# Patient Record
Sex: Female | Born: 2000 | Race: Black or African American | Hispanic: No | Marital: Single | Smoking: Never smoker
Health system: Southern US, Community
[De-identification: ages and names within clinical notes are randomized; demographics above are authoritative.]

---
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
## 2021-08-28 ENCOUNTER — Other Ambulatory Visit: Payer: Self-pay

## 2021-08-28 ENCOUNTER — Encounter (HOSPITAL_COMMUNITY): Payer: Self-pay | Admitting: Emergency Medicine

## 2021-08-28 ENCOUNTER — Emergency Department (HOSPITAL_COMMUNITY)
Admission: EM | Admit: 2021-08-28 | Discharge: 2021-08-29 | Disposition: A | Discharge status: Home / Self Care | Payer: Medicaid Other | Attending: Emergency Medicine | Admitting: Emergency Medicine

## 2021-08-28 DIAGNOSIS — D72829 Elevated white blood cell count, unspecified: Secondary | ICD-10-CM | POA: Insufficient documentation

## 2021-08-28 DIAGNOSIS — J36 Peritonsillar abscess: Secondary | ICD-10-CM | POA: Diagnosis not present

## 2021-08-28 DIAGNOSIS — J029 Acute pharyngitis, unspecified: Secondary | ICD-10-CM | POA: Diagnosis present

## 2021-08-28 LAB — GROUP A STREP BY PCR: Group A Strep by PCR: NOT DETECTED

## 2021-08-28 NOTE — ED Triage Notes (Signed)
Pt c/o sore throat and left ear pain x 4 days.

## 2021-08-29 ENCOUNTER — Emergency Department (HOSPITAL_COMMUNITY): Payer: Medicaid Other

## 2021-08-29 LAB — BASIC METABOLIC PANEL
Anion gap: 9 (ref 5–15)
BUN: 8 mg/dL (ref 6–20)
CO2: 21 mmol/L — ABNORMAL LOW (ref 22–32)
Calcium: 9.1 mg/dL (ref 8.9–10.3)
Chloride: 102 mmol/L (ref 98–111)
Creatinine, Ser: 0.85 mg/dL (ref 0.44–1.00)
GFR, Estimated: >60 mL/min (ref >60)
Glucose, Bld: 101 mg/dL — ABNORMAL HIGH (ref 70–99)
Potassium: 4.2 mmol/L (ref 3.5–5.1)
Sodium: 132 mmol/L — ABNORMAL LOW (ref 135–145)

## 2021-08-29 LAB — CBC WITH DIFFERENTIAL/PLATELET
Abs Immature Granulocytes: 0.05 10*3/uL (ref 0.00–0.07)
Basophils Absolute: 0.1 10*3/uL (ref 0.0–0.1)
Basophils Relative: 0 %
Eosinophils Absolute: 0.1 10*3/uL (ref 0.0–0.5)
Eosinophils Relative: 1 %
HCT: 35.3 % — ABNORMAL LOW (ref 36.0–46.0)
Hemoglobin: 11.7 g/dL — ABNORMAL LOW (ref 12.0–15.0)
Immature Granulocytes: 0 %
Lymphocytes Relative: 15 %
Lymphs Abs: 1.7 10*3/uL (ref 0.7–4.0)
MCH: 28.5 pg (ref 26.0–34.0)
MCHC: 33.1 g/dL (ref 30.0–36.0)
MCV: 86.1 fL (ref 80.0–100.0)
Monocytes Absolute: 0.8 10*3/uL (ref 0.1–1.0)
Monocytes Relative: 7 %
Neutro Abs: 8.8 10*3/uL — ABNORMAL HIGH (ref 1.7–7.7)
Neutrophils Relative %: 77 %
Platelets: 276 10*3/uL (ref 150–400)
RBC: 4.1 MIL/uL (ref 3.87–5.11)
RDW: 12 % (ref 11.5–15.5)
WBC: 11.5 10*3/uL — ABNORMAL HIGH (ref 4.0–10.5)
nRBC: 0 % (ref 0.0–0.2)

## 2021-08-29 LAB — I-STAT BETA HCG BLOOD, ED (MC, WL, AP ONLY): I-stat hCG, quantitative: <5 m[IU]/mL (ref <5)

## 2021-08-29 MED ORDER — DEXAMETHASONE SODIUM PHOSPHATE 10 MG/ML IJ SOLN
10.0000 mg | Freq: Once | INTRAMUSCULAR | Status: AC
  Administered 2021-08-29: 10 mg via INTRAVENOUS
  Filled 2021-08-29: qty 1

## 2021-08-29 MED ORDER — AMOXICILLIN-POT CLAVULANATE 875-125 MG PO TABS
1.0000 | ORAL_TABLET | Freq: Once | ORAL | Status: AC
  Administered 2021-08-29: 1 via ORAL
  Filled 2021-08-29: qty 1

## 2021-08-29 MED ORDER — IOHEXOL 300 MG/ML  SOLN
100.0000 mL | Freq: Once | INTRAMUSCULAR | Status: AC | PRN
  Administered 2021-08-29: 100 mL via INTRAVENOUS

## 2021-08-29 MED ORDER — LACTATED RINGERS IV BOLUS
1000.0000 mL | Freq: Once | INTRAVENOUS | Status: AC
  Administered 2021-08-29: 1000 mL via INTRAVENOUS

## 2021-08-29 MED ORDER — AMOXICILLIN-POT CLAVULANATE 875-125 MG PO TABS: 1.0000 | ORAL_TABLET | Freq: Two times a day (BID) | ORAL | 0 refills | Status: AC

## 2021-08-29 NOTE — ED Provider Notes (Signed)
? ?[facility] EMERGENCY DEPARTMENT  ?Provider Note ? ?CSN: [id number] ?Arrival date & time: 08/28/21 1918 ? ?History ?Chief Complaint  ?Patient presents with  ? Sore Throat  ? ? ?Ann Brennan is a 21 y.o. female reports several days of worsening L sided sore throat, worse with swallowing, radiating into L ear. Subjective fevers at home.  ? ? ?Home Medications ?Prior to Admission medications   ?Medication Sig Start Date End Date Taking? Authorizing Provider  ?amoxicillin-clavulanate (AUGMENTIN) 875-125 MG tablet Take 1 tablet by mouth every 12 (twelve) hours. 08/29/21  Yes Pollyann Savoy, MD  ?diclofenac (VOLTAREN) 75 MG EC tablet Take 1 tablet (75 mg total) by mouth 2 (two) times daily. 12/17/19   Mardella Layman, MD  ?ondansetron (ZOFRAN ODT) 4 MG disintegrating tablet Take 1 tablet (4 mg total) by mouth every 8 (eight) hours as needed. ?Patient not taking: Reported on 07/20/2017 01/20/15   Ree Shay, MD  ?predniSONE (STERAPRED UNI-PAK 21 TAB) 10 MG (21) TBPK tablet Take by mouth daily. Take as directed. 12/17/19   Mardella Layman, MD  ? ? ? ?Allergies    ?Patient has no known allergies. ? ? ?Review of Systems   ?Review of Systems ?Please see HPI for pertinent positives and negatives ? ?Physical Exam ?BP 115/82 (BP Location: Right Arm)   Pulse 88   Temp 99 ?F (37.2 ?C) (Oral)   Resp 18   Ht 5\' 8"  (1.727 m)   Wt 94.3 kg   LMP 08/04/2021 (Exact Date)   SpO2 99%   BMI 31.63 kg/m?  ? ?Physical Exam ?Vitals and nursing note reviewed.  ?Constitutional:   ?   Appearance: Normal appearance.  ?HENT:  ?   Head: Normocephalic and atraumatic.  ?   Nose: Nose normal.  ?   Mouth/Throat:  ?   Mouth: Mucous membranes are moist.  ?   Pharynx: Uvula midline. Posterior oropharyngeal erythema present. No oropharyngeal exudate.  ?   Tonsils: Tonsillar abscess (likely, left) present. No tonsillar exudate. 1+ on the right. 3+ on the left.  ?   Comments: Hot potato voice ?Eyes:  ?   Extraocular Movements:  Extraocular movements intact.  ?   Conjunctiva/sclera: Conjunctivae normal.  ?Cardiovascular:  ?   Rate and Rhythm: Normal rate.  ?Pulmonary:  ?   Effort: Pulmonary effort is normal.  ?   Breath sounds: Normal breath sounds.  ?Abdominal:  ?   General: Abdomen is flat.  ?   Palpations: Abdomen is soft.  ?   Tenderness: There is no abdominal tenderness.  ?Musculoskeletal:     ?   General: No swelling. Normal range of motion.  ?   Cervical back: Neck supple.  ?Lymphadenopathy:  ?   Cervical: Cervical adenopathy present.  ?Skin: ?   General: Skin is warm and dry.  ?Neurological:  ?   General: No focal deficit present.  ?   Mental Status: She is alert.  ?Psychiatric:     ?   Mood and Affect: Mood normal.  ? ? ?ED Results / Procedures / Treatments   ?EKG ?None ? ?Procedures ?Procedures ? ?Medications Ordered in the ED ?Medications  ?amoxicillin-clavulanate (AUGMENTIN) 875-125 MG per tablet 1 tablet (has no administration in time range)  ?lactated ringers bolus 1,000 mL (1,000 mLs Intravenous New Bag/Given 08/29/21 0257)  ?dexamethasone (DECADRON) injection 10 mg (10 mg Intravenous Given 08/29/21 0257)  ?iohexol (OMNIPAQUE) 300 MG/ML solution 100 mL (100 mLs Intravenous Contrast Given 08/29/21 0322)  ? ? ?  Initial Impression and Plan ? Patient with exam concerning for peritonsillar abscess. Strep was neg. Will add labs, CT and decadron for swelling.  ? ?ED Course  ? ?Clinical Course as of 08/29/21 0435  ?Mon Aug 29, 2021  ?0253 CBC with mild leukocytosis.  [CS]  ?0304 HCG is neg.  [CS]  ?0307 BMP is neg.  [CS]  ?6160 I personally viewed the images from radiology studies and agree with radiologist interpretation: Early abscess, not amenable to drainage. Will begin Abx, refer to ENT for follow up.  ? [CS]  ?  ?Clinical Course User Index ?[CS] Pollyann Savoy, MD  ? ? ? ?MDM Rules/Calculators/A&P ?Medical Decision Making ?Problems Addressed: ?Peritonsillar abscess: acute illness or injury ? ?Amount and/or Complexity of Data  Reviewed ?Labs: ordered. Decision-making details documented in ED Course. ?Radiology: ordered and independent interpretation performed. Decision-making details documented in ED Course. ? ?Risk ?Prescription drug management. ? ? ? ?Final Clinical Impression(s) / ED Diagnoses ?Final diagnoses:  ?Peritonsillar abscess  ? ? ?Rx / DC Orders ?ED Discharge Orders   ? ?      Ordered  ?  amoxicillin-clavulanate (AUGMENTIN) 875-125 MG tablet  Every 12 hours       ? 08/29/21 0435  ? ?  ?  ? ?  ? ?  ?Pollyann Savoy, MD ?08/29/21 [phone] ? ?
